# Patient Record
Sex: Female | Born: 1970 | Race: White | Hispanic: Yes
Health system: Southern US, Community
[De-identification: ages and names within clinical notes are randomized; demographics above are authoritative.]

## PROBLEM LIST (undated history)

## (undated) DIAGNOSIS — I10 Essential (primary) hypertension: Secondary | ICD-10-CM

## (undated) DIAGNOSIS — E119 Type 2 diabetes mellitus without complications: Secondary | ICD-10-CM

## (undated) HISTORY — PX: TUBAL LIGATION: SHX77

---

## 2000-05-19 ENCOUNTER — Other Ambulatory Visit: Admission: RE | Admit: 2000-05-19 | Discharge: 2000-05-19 | Payer: Self-pay | Admitting: Family Medicine

## 2004-05-26 ENCOUNTER — Other Ambulatory Visit: Admission: RE | Admit: 2004-05-26 | Discharge: 2004-05-26 | Payer: Self-pay | Admitting: Family Medicine

## 2004-09-09 ENCOUNTER — Ambulatory Visit: Payer: Self-pay | Admitting: Family Medicine

## 2004-10-15 ENCOUNTER — Ambulatory Visit: Payer: Self-pay | Admitting: Family Medicine

## 2004-11-17 ENCOUNTER — Ambulatory Visit: Payer: Self-pay | Admitting: Family Medicine

## 2004-12-17 ENCOUNTER — Ambulatory Visit: Payer: Self-pay | Admitting: Family Medicine

## 2005-01-05 ENCOUNTER — Ambulatory Visit: Payer: Self-pay | Admitting: Family Medicine

## 2005-01-12 ENCOUNTER — Ambulatory Visit: Payer: Self-pay | Admitting: Family Medicine

## 2005-01-20 ENCOUNTER — Ambulatory Visit: Payer: Self-pay | Admitting: Family Medicine

## 2005-07-20 ENCOUNTER — Ambulatory Visit: Payer: Self-pay | Admitting: Family Medicine

## 2005-07-26 ENCOUNTER — Ambulatory Visit: Payer: Self-pay | Admitting: Family Medicine

## 2005-08-25 ENCOUNTER — Ambulatory Visit: Payer: Self-pay | Admitting: Family Medicine

## 2016-03-18 ENCOUNTER — Emergency Department (HOSPITAL_COMMUNITY): Payer: No Typology Code available for payment source

## 2016-03-18 ENCOUNTER — Encounter (HOSPITAL_COMMUNITY): Payer: Self-pay | Admitting: *Deleted

## 2016-03-18 ENCOUNTER — Observation Stay (HOSPITAL_COMMUNITY)
Admission: EM | Admit: 2016-03-18 | Discharge: 2016-03-19 | Disposition: A | Discharge status: Home / Self Care | Payer: No Typology Code available for payment source | Attending: Internal Medicine | Admitting: Internal Medicine

## 2016-03-18 DIAGNOSIS — E119 Type 2 diabetes mellitus without complications: Secondary | ICD-10-CM | POA: Diagnosis not present

## 2016-03-18 DIAGNOSIS — Z7984 Long term (current) use of oral hypoglycemic drugs: Secondary | ICD-10-CM | POA: Insufficient documentation

## 2016-03-18 DIAGNOSIS — Z79899 Other long term (current) drug therapy: Secondary | ICD-10-CM | POA: Insufficient documentation

## 2016-03-18 DIAGNOSIS — Y92410 Unspecified street and highway as the place of occurrence of the external cause: Secondary | ICD-10-CM | POA: Diagnosis not present

## 2016-03-18 DIAGNOSIS — J45909 Unspecified asthma, uncomplicated: Secondary | ICD-10-CM | POA: Diagnosis present

## 2016-03-18 DIAGNOSIS — R000 Tachycardia, unspecified: Secondary | ICD-10-CM | POA: Diagnosis not present

## 2016-03-18 DIAGNOSIS — D72829 Elevated white blood cell count, unspecified: Secondary | ICD-10-CM | POA: Diagnosis not present

## 2016-03-18 DIAGNOSIS — I10 Essential (primary) hypertension: Secondary | ICD-10-CM | POA: Diagnosis not present

## 2016-03-18 DIAGNOSIS — H547 Unspecified visual loss: Secondary | ICD-10-CM

## 2016-03-18 DIAGNOSIS — Z6841 Body Mass Index (BMI) 40.0 and over, adult: Secondary | ICD-10-CM | POA: Insufficient documentation

## 2016-03-18 DIAGNOSIS — R55 Syncope and collapse: Principal | ICD-10-CM | POA: Insufficient documentation

## 2016-03-18 HISTORY — DX: Type 2 diabetes mellitus without complications: E11.9

## 2016-03-18 HISTORY — DX: Essential (primary) hypertension: I10

## 2016-03-18 LAB — I-STAT TROPONIN, ED: TROPONIN I, POC: 0 ng/mL (ref 0.00–0.08)

## 2016-03-18 LAB — CBC
HEMATOCRIT: 40.7 % (ref 36.0–46.0)
Hemoglobin: 14.4 g/dL (ref 12.0–15.0)
MCH: 27.7 pg (ref 26.0–34.0)
MCHC: 35.4 g/dL (ref 30.0–36.0)
MCV: 78.4 fL (ref 78.0–100.0)
PLATELETS: 384 10*3/uL (ref 150–400)
RBC: 5.19 MIL/uL — ABNORMAL HIGH (ref 3.87–5.11)
RDW: 13.9 % (ref 11.5–15.5)
WBC: 16.9 10*3/uL — AB (ref 4.0–10.5)

## 2016-03-18 LAB — BASIC METABOLIC PANEL
Anion gap: 10 (ref 5–15)
BUN: 14 mg/dL (ref 6–20)
CO2: 25 mmol/L (ref 22–32)
CREATININE: 0.65 mg/dL (ref 0.44–1.00)
Calcium: 8.5 mg/dL — ABNORMAL LOW (ref 8.9–10.3)
Chloride: 102 mmol/L (ref 101–111)
GFR calc Af Amer: >60 mL/min (ref >60)
GLUCOSE: 142 mg/dL — AB (ref 65–99)
Potassium: 3.9 mmol/L (ref 3.5–5.1)
SODIUM: 137 mmol/L (ref 135–145)

## 2016-03-18 LAB — I-STAT CHEM 8, ED
BUN: 15 mg/dL (ref 6–20)
CALCIUM ION: 1.04 mmol/L — AB (ref 1.12–1.23)
CREATININE: 0.5 mg/dL (ref 0.44–1.00)
Chloride: 102 mmol/L (ref 101–111)
GLUCOSE: 137 mg/dL — AB (ref 65–99)
HCT: 47 % — ABNORMAL HIGH (ref 36.0–46.0)
HEMOGLOBIN: 16 g/dL — AB (ref 12.0–15.0)
Potassium: 3.9 mmol/L (ref 3.5–5.1)
Sodium: 139 mmol/L (ref 135–145)
TCO2: 26 mmol/L (ref 0–100)

## 2016-03-18 LAB — TROPONIN I: Troponin I: <0.03 ng/mL (ref <0.031)

## 2016-03-18 MED ORDER — SODIUM CHLORIDE 0.9 % IV BOLUS (SEPSIS)
1000.0000 mL | Freq: Once | INTRAVENOUS | Status: AC
  Administered 2016-03-18: 1000 mL via INTRAVENOUS

## 2016-03-18 MED ORDER — LORAZEPAM 1 MG PO TABS
1.0000 mg | ORAL_TABLET | Freq: Once | ORAL | Status: AC
  Administered 2016-03-18: 1 mg via ORAL
  Filled 2016-03-18: qty 1

## 2016-03-18 MED ORDER — IOPAMIDOL (ISOVUE-370) INJECTION 76%: 100.0000 mL | Freq: Once | INTRAVENOUS | Status: DC | PRN

## 2016-03-18 NOTE — ED Notes (Signed)
RN,Holly drawing labs.
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
## 2016-03-18 NOTE — ED Notes (Signed)
Pt in CT.
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
## 2016-03-18 NOTE — ED Notes (Signed)
Pt ambulated to restroom. 

## 2016-03-18 NOTE — ED Notes (Signed)
Pt ambulated to restroom and stated "I feel much better than before."

## 2016-03-18 NOTE — ED Notes (Signed)
Bed: WA06 Expected date:  Expected time:  Means of arrival:  Comments: RM: 26
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
## 2016-03-19 ENCOUNTER — Encounter (HOSPITAL_COMMUNITY): Payer: Self-pay | Admitting: Family Medicine

## 2016-03-19 ENCOUNTER — Observation Stay (HOSPITAL_BASED_OUTPATIENT_CLINIC_OR_DEPARTMENT_OTHER): Payer: No Typology Code available for payment source

## 2016-03-19 ENCOUNTER — Observation Stay (HOSPITAL_COMMUNITY): Payer: No Typology Code available for payment source

## 2016-03-19 DIAGNOSIS — D72829 Elevated white blood cell count, unspecified: Secondary | ICD-10-CM | POA: Diagnosis not present

## 2016-03-19 DIAGNOSIS — R55 Syncope and collapse: Secondary | ICD-10-CM | POA: Diagnosis present

## 2016-03-19 DIAGNOSIS — J45909 Unspecified asthma, uncomplicated: Secondary | ICD-10-CM | POA: Diagnosis present

## 2016-03-19 DIAGNOSIS — I10 Essential (primary) hypertension: Secondary | ICD-10-CM

## 2016-03-19 DIAGNOSIS — R000 Tachycardia, unspecified: Secondary | ICD-10-CM | POA: Diagnosis present

## 2016-03-19 DIAGNOSIS — E119 Type 2 diabetes mellitus without complications: Secondary | ICD-10-CM

## 2016-03-19 LAB — CBC WITH DIFFERENTIAL/PLATELET
BASOS PCT: 0 %
Basophils Absolute: 0 10*3/uL (ref 0.0–0.1)
EOS ABS: 0.1 10*3/uL (ref 0.0–0.7)
Eosinophils Relative: 1 %
HCT: 40.5 % (ref 36.0–46.0)
HEMOGLOBIN: 13.8 g/dL (ref 12.0–15.0)
Lymphocytes Relative: 21 %
Lymphs Abs: 3.2 10*3/uL (ref 0.7–4.0)
MCH: 27.5 pg (ref 26.0–34.0)
MCHC: 34.1 g/dL (ref 30.0–36.0)
MCV: 80.7 fL (ref 78.0–100.0)
MONO ABS: 0.9 10*3/uL (ref 0.1–1.0)
MONOS PCT: 6 %
NEUTROS PCT: 72 %
Neutro Abs: 11.2 10*3/uL — ABNORMAL HIGH (ref 1.7–7.7)
Platelets: 357 10*3/uL (ref 150–400)
RBC: 5.02 MIL/uL (ref 3.87–5.11)
RDW: 14.2 % (ref 11.5–15.5)
WBC: 15.4 10*3/uL — ABNORMAL HIGH (ref 4.0–10.5)

## 2016-03-19 LAB — URINALYSIS, ROUTINE W REFLEX MICROSCOPIC
Bilirubin Urine: NEGATIVE
GLUCOSE, UA: NEGATIVE mg/dL
KETONES UR: NEGATIVE mg/dL
LEUKOCYTES UA: NEGATIVE
Nitrite: NEGATIVE
Protein, ur: NEGATIVE mg/dL
Specific Gravity, Urine: 1.007 (ref 1.005–1.030)
pH: 6.5 (ref 5.0–8.0)

## 2016-03-19 LAB — GLUCOSE, CAPILLARY
Glucose-Capillary: 99 mg/dL (ref 65–99)
Glucose-Capillary: 128 mg/dL — ABNORMAL HIGH (ref 65–99)

## 2016-03-19 LAB — ECHOCARDIOGRAM COMPLETE
HEIGHTINCHES: 61 in
Weight: 3761.6 oz

## 2016-03-19 LAB — URINE MICROSCOPIC-ADD ON
Bacteria, UA: NONE SEEN
WBC UA: NONE SEEN WBC/hpf (ref 0–5)

## 2016-03-19 LAB — PROCALCITONIN: Procalcitonin: <0.1 ng/mL

## 2016-03-19 MED ORDER — INSULIN ASPART 100 UNIT/ML ~~LOC~~ SOLN: 0.0000 [IU] | Freq: Three times a day (TID) | SUBCUTANEOUS | Status: DC

## 2016-03-19 MED ORDER — HYDROCODONE-ACETAMINOPHEN 5-325 MG PO TABS: 1.0000 | ORAL_TABLET | ORAL | Status: DC | PRN

## 2016-03-19 MED ORDER — ONDANSETRON HCL 4 MG/2ML IJ SOLN: 4.0000 mg | Freq: Four times a day (QID) | INTRAMUSCULAR | Status: DC | PRN

## 2016-03-19 MED ORDER — LORATADINE 10 MG PO TABS: 10.0000 mg | ORAL_TABLET | Freq: Every day | ORAL | Status: DC | PRN

## 2016-03-19 MED ORDER — ENOXAPARIN SODIUM 40 MG/0.4ML ~~LOC~~ SOLN
40.0000 mg | SUBCUTANEOUS | Status: DC
  Administered 2016-03-19: 40 mg via SUBCUTANEOUS
  Filled 2016-03-19: qty 0.4

## 2016-03-19 MED ORDER — ONDANSETRON HCL 4 MG PO TABS: 4.0000 mg | ORAL_TABLET | Freq: Four times a day (QID) | ORAL | Status: DC | PRN

## 2016-03-19 MED ORDER — ACETAMINOPHEN 650 MG RE SUPP: 650.0000 mg | Freq: Four times a day (QID) | RECTAL | Status: DC | PRN

## 2016-03-19 MED ORDER — SODIUM CHLORIDE 0.9% FLUSH: 3.0000 mL | Freq: Two times a day (BID) | INTRAVENOUS | Status: DC

## 2016-03-19 MED ORDER — SODIUM CHLORIDE 0.9 % IV SOLN
INTRAVENOUS | Status: AC
  Administered 2016-03-19: 03:00:00 via INTRAVENOUS

## 2016-03-19 MED ORDER — POLYETHYLENE GLYCOL 3350 17 G PO PACK: 17.0000 g | PACK | Freq: Every day | ORAL | Status: DC | PRN

## 2016-03-19 MED ORDER — ACETAMINOPHEN 325 MG PO TABS
650.0000 mg | ORAL_TABLET | Freq: Four times a day (QID) | ORAL | Status: DC | PRN
  Administered 2016-03-19: 650 mg via ORAL
  Filled 2016-03-19: qty 2

## 2016-03-19 MED ORDER — LISINOPRIL 10 MG PO TABS
10.0000 mg | ORAL_TABLET | Freq: Every day | ORAL | Status: DC
  Administered 2016-03-19: 10 mg via ORAL
  Filled 2016-03-19: qty 1

## 2016-03-19 MED ORDER — ALBUTEROL SULFATE (2.5 MG/3ML) 0.083% IN NEBU: 2.5000 mg | INHALATION_SOLUTION | RESPIRATORY_TRACT | Status: DC | PRN

## 2016-03-19 MED ORDER — ALBUTEROL SULFATE HFA 108 (90 BASE) MCG/ACT IN AERS: 2.0000 | INHALATION_SPRAY | RESPIRATORY_TRACT | Status: DC | PRN

## 2016-03-19 MED ORDER — BISACODYL 5 MG PO TBEC: 5.0000 mg | DELAYED_RELEASE_TABLET | Freq: Every day | ORAL | Status: DC | PRN
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
## 2016-03-19 NOTE — Progress Notes (Signed)
  Echocardiogram 2D Echocardiogram has been performed.  Arvil ChacoFoster, Ajai Harville 03/19/2016, 11:39 AM

## 2016-03-20 LAB — URINE CULTURE

## 2016-03-20 LAB — HEMOGLOBIN A1C
HEMOGLOBIN A1C: 6.5 % — AB (ref 4.8–5.6)
MEAN PLASMA GLUCOSE: 140 mg/dL
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
## 2021-04-02 ENCOUNTER — Telehealth: Payer: Self-pay | Admitting: Hematology

## 2021-04-02 NOTE — Telephone Encounter (Signed)
Patient referred by Terrilyn Saver, NP for Elevated WBC.  Appt made for 04/14/21 Consult 9:00 am Labs 10:15 if needed

## 2021-04-14 ENCOUNTER — Inpatient Hospital Stay: Payer: Medicaid Other | Admitting: Hematology

## 2021-04-14 ENCOUNTER — Inpatient Hospital Stay: Payer: Medicaid Other

## 2022-03-19 ENCOUNTER — Other Ambulatory Visit: Payer: Self-pay | Admitting: Hematology and Oncology

## 2022-03-19 ENCOUNTER — Other Ambulatory Visit: Payer: Self-pay

## 2022-03-19 DIAGNOSIS — D72829 Elevated white blood cell count, unspecified: Secondary | ICD-10-CM

## 2022-03-22 ENCOUNTER — Other Ambulatory Visit: Payer: 59

## 2022-03-22 ENCOUNTER — Encounter: Payer: 59 | Admitting: Hematology and Oncology
